# Patient Record
Sex: Male | Born: 1985
Health system: Southern US, Community
[De-identification: ages and names within clinical notes are randomized; demographics above are authoritative.]

## PROBLEM LIST (undated history)

## (undated) HISTORY — PX: COLON SURGERY: SHX602

---

## 2012-09-11 ENCOUNTER — Ambulatory Visit
Admission: RE | Admit: 2012-09-11 | Discharge: 2012-09-11 | Disposition: A | Discharge status: Home / Self Care | Payer: 59 | Source: Ambulatory Visit | Attending: Physician Assistant | Admitting: Physician Assistant

## 2012-09-11 ENCOUNTER — Other Ambulatory Visit: Payer: Self-pay | Admitting: Physician Assistant

## 2012-09-11 DIAGNOSIS — M79609 Pain in unspecified limb: Secondary | ICD-10-CM

## 2016-01-04 ENCOUNTER — Emergency Department (HOSPITAL_COMMUNITY)
Admission: EM | Admit: 2016-01-04 | Discharge: 2016-01-05 | Disposition: A | Discharge status: Home / Self Care | Payer: 59 | Attending: Emergency Medicine | Admitting: Emergency Medicine

## 2016-01-04 ENCOUNTER — Encounter (HOSPITAL_COMMUNITY): Payer: Self-pay

## 2016-01-04 DIAGNOSIS — R1013 Epigastric pain: Secondary | ICD-10-CM | POA: Insufficient documentation

## 2016-01-04 LAB — CBC
HEMATOCRIT: 45.1 % (ref 39.0–52.0)
HEMOGLOBIN: 15.5 g/dL (ref 13.0–17.0)
MCH: 31.4 pg (ref 26.0–34.0)
MCHC: 34.4 g/dL (ref 30.0–36.0)
MCV: 91.5 fL (ref 78.0–100.0)
Platelets: 158 10*3/uL (ref 150–400)
RBC: 4.93 MIL/uL (ref 4.22–5.81)
RDW: 13.4 % (ref 11.5–15.5)
WBC: 7.6 10*3/uL (ref 4.0–10.5)

## 2016-01-04 LAB — URINALYSIS, ROUTINE W REFLEX MICROSCOPIC
Bilirubin Urine: NEGATIVE
Glucose, UA: NEGATIVE mg/dL
HGB URINE DIPSTICK: NEGATIVE
Ketones, ur: NEGATIVE mg/dL
Leukocytes, UA: NEGATIVE
NITRITE: NEGATIVE
PH: 8.5 — AB (ref 5.0–8.0)
Protein, ur: NEGATIVE mg/dL
SPECIFIC GRAVITY, URINE: 1.023 (ref 1.005–1.030)

## 2016-01-04 LAB — COMPREHENSIVE METABOLIC PANEL
ALBUMIN: 3.5 g/dL (ref 3.5–5.0)
ALK PHOS: 55 U/L (ref 38–126)
ALT: 46 U/L (ref 17–63)
ANION GAP: 6 (ref 5–15)
AST: 37 U/L (ref 15–41)
BUN: 14 mg/dL (ref 6–20)
CALCIUM: 9.2 mg/dL (ref 8.9–10.3)
CO2: 31 mmol/L (ref 22–32)
Chloride: 103 mmol/L (ref 101–111)
Creatinine, Ser: 1.11 mg/dL (ref 0.61–1.24)
GFR calc non Af Amer: >60 mL/min (ref >60)
GLUCOSE: 94 mg/dL (ref 65–99)
POTASSIUM: 4.3 mmol/L (ref 3.5–5.1)
SODIUM: 140 mmol/L (ref 135–145)
Total Bilirubin: 0.7 mg/dL (ref 0.3–1.2)
Total Protein: 6.8 g/dL (ref 6.5–8.1)

## 2016-01-04 LAB — LIPASE, BLOOD: LIPASE: 21 U/L (ref 11–51)

## 2016-01-04 MED ORDER — SODIUM CHLORIDE 0.9 % IV BOLUS (SEPSIS)
1000.0000 mL | Freq: Once | INTRAVENOUS | Status: AC
  Administered 2016-01-04: 1000 mL via INTRAVENOUS

## 2016-01-04 MED ORDER — FAMOTIDINE IN NACL 20-0.9 MG/50ML-% IV SOLN
20.0000 mg | Freq: Once | INTRAVENOUS | Status: AC
  Administered 2016-01-04: 20 mg via INTRAVENOUS
  Filled 2016-01-04: qty 50

## 2016-01-04 MED ORDER — DICYCLOMINE HCL 20 MG PO TABS: 20.0000 mg | ORAL_TABLET | Freq: Two times a day (BID) | ORAL | 0 refills | Status: DC

## 2016-01-04 MED ORDER — FAMOTIDINE 20 MG PO TABS: 20.0000 mg | ORAL_TABLET | Freq: Two times a day (BID) | ORAL | 0 refills | Status: DC

## 2016-01-04 NOTE — ED Provider Notes (Signed)
[facility]-EMERGENCY DEPT Provider Note   CSN: [id number] Arrival date & time: 01/04/16  1843     History   Chief Complaint Chief Complaint  Patient presents with  . Abdominal Pain    HPI Michael Rhodes is a 30 y.o. male.  The history is provided by the patient.  Abdominal Pain   This is a new problem. The current episode started 6 to 12 hours ago (started about 4pm). The problem occurs constantly. The problem has been gradually improving (inensified initially and then got better). The pain is located in the epigastric region. The pain is severe. Pertinent negatives include fever, diarrhea, vomiting and dysuria. Associated symptoms comments: Radiated to the back  . Nothing aggravates the symptoms. Nothing relieves the symptoms. His past medical history does not include gallstones.    History reviewed. No pertinent past medical history.  There are no active problems to display for this patient.   Past Surgical History:  Procedure Laterality Date  . COLON SURGERY     polyp removal       Home Medications    Prior to Admission medications   Medication Sig Start Date End Date Taking? Authorizing Provider  dicyclomine (BENTYL) 20 MG tablet Take 1 tablet (20 mg total) by mouth 2 (two) times daily. 01/04/16   Linwood DibblesJon Norman Piacentini, MD  famotidine (PEPCID) 20 MG tablet Take 1 tablet (20 mg total) by mouth 2 (two) times daily. 01/04/16   Linwood DibblesJon Allyssa Abruzzese, MD    Family History History reviewed. No pertinent family history.  Social History Social History  Substance Use Topics  . Smoking status: Never Smoker  . Smokeless tobacco: Never Used  . Alcohol use Not on file     Allergies   Review of patient's allergies indicates no known allergies.   Review of Systems Review of Systems  Constitutional: Negative for fever.  Gastrointestinal: Positive for abdominal pain. Negative for diarrhea and vomiting.  Genitourinary: Negative for dysuria.  All other systems reviewed and are  negative.    Physical Exam Updated Vital Signs BP 133/91 (BP Location: Left Arm)   Pulse (!) 50   Temp 98.4 F (36.9 C) (Oral)   Resp 18   Ht 6\' 2"  (1.88 m)   Wt 76.7 kg   SpO2 100%   BMI 21.70 kg/m   Physical Exam  Constitutional: He appears well-developed and well-nourished. No distress.  HENT:  Head: Normocephalic and atraumatic.  Right Ear: External ear normal.  Left Ear: External ear normal.  Eyes: Conjunctivae are normal. Right eye exhibits no discharge. Left eye exhibits no discharge. No scleral icterus.  Neck: Neck supple. No tracheal deviation present.  Cardiovascular: Normal rate, regular rhythm and intact distal pulses.   Pulmonary/Chest: Effort normal and breath sounds normal. No stridor. No respiratory distress. He has no wheezes. He has no rales.  Abdominal: Soft. Bowel sounds are normal. He exhibits no distension. There is tenderness in the epigastric area. There is no rebound, no guarding, no tenderness at McBurney's point and negative Murphy's sign.  Musculoskeletal: He exhibits no edema or tenderness.  Neurological: He is alert. He has normal strength. No cranial nerve deficit (no facial droop, extraocular movements intact, no slurred speech) or sensory deficit. He exhibits normal muscle tone. He displays no seizure activity. Coordination normal.  Skin: Skin is warm and dry. No rash noted.  Psychiatric: He has a normal mood and affect.  Nursing note and vitals reviewed.    ED Treatments / Results  Labs (all labs ordered are  listed, but only abnormal results are displayed) Labs Reviewed  URINALYSIS, ROUTINE W REFLEX MICROSCOPIC (NOT AT [facility]) - Abnormal; Notable for the following:       Result Value   pH 8.5 (*)    All other components within normal limits  LIPASE, BLOOD  COMPREHENSIVE METABOLIC PANEL  CBC    EKGProcedures Procedures (including critical care time)  Medications Ordered in ED Medications  sodium chloride 0.9 % bolus 1,000 mL (1,000  mLs Intravenous New Bag/Given 01/04/16 2234)  famotidine (PEPCID) IVPB 20 mg premix (0 mg Intravenous Stopped 01/04/16 2301)     Initial Impression / Assessment and Plan / ED Course  I have reviewed the triage vital signs and the nursing notes.  Pertinent labs & imaging results that were available during my care of the patient were reviewed by me and considered in my medical decision making (see chart for details).  Clinical Course  Comment By Time  Discussed initial test results with patient.  Pain is improving but still has some ttp in the epigastric region.  Discussed [place].  Pt is concerned about cost.  No focal ttp in the epigastric region.   Overall, doubt acute cholecystitis.  Will give dose of antacids, check urine and reassess Linwood Dibbles, MD 08/31 2210    Pt is feeling better.  He would prefer to avoid any imaging unless necessary.  Overall I have a low suspicion for cholecystitis or other emergent surgical issue.  Warning signs and precautions discussed.  Final Clinical Impressions(s) / ED Diagnoses   Final diagnoses:  Epigastric pain    New Prescriptions New Prescriptions   DICYCLOMINE (BENTYL) 20 MG TABLET    Take 1 tablet (20 mg total) by mouth 2 (two) times daily.   FAMOTIDINE (PEPCID) 20 MG TABLET    Take 1 tablet (20 mg total) by mouth 2 (two) times daily.     Linwood Dibbles, MD 01/04/16 [phone]

## 2016-01-04 NOTE — ED Triage Notes (Signed)
Pt presents from home via EMS with c/o abdominal pain. [facility] and after a nap this afternoon, is having sudden onset mid, sharp abdominal pain. Pt has no obvious mass or distention noted, just tenderness to palpation. No nausea or vomiting, denies diarrhea.

## 2016-01-04 NOTE — ED Notes (Signed)
Pt stated "[facility] and shortly after my stomach starting hurting really bad."  Pt denies n/v/d.

## 2016-01-04 NOTE — ED Notes (Signed)
Bed: WTR9 Expected date:  Expected time:  Means of arrival:  Comments: 

## 2016-01-04 NOTE — Progress Notes (Signed)
Patient reports his pcp is located at [facility] on [address]  System updated.

## 2016-01-04 NOTE — Discharge Instructions (Signed)
Return to the emergency room for fever vomiting or recurrent symptoms. Follow up with primary doctor next week to be rechecked

## 2016-01-05 MED ORDER — FAMOTIDINE 20 MG PO TABS: 20.0000 mg | ORAL_TABLET | Freq: Two times a day (BID) | ORAL | 0 refills | Status: DC

## 2016-01-05 MED ORDER — DICYCLOMINE HCL 20 MG PO TABS: 20.0000 mg | ORAL_TABLET | Freq: Two times a day (BID) | ORAL | 0 refills | Status: DC

## 2017-04-01 ENCOUNTER — Ambulatory Visit
Admission: RE | Admit: 2017-04-01 | Discharge: 2017-04-01 | Disposition: A | Discharge status: Home / Self Care | Payer: 59 | Source: Ambulatory Visit | Attending: Family Medicine | Admitting: Family Medicine

## 2017-04-01 ENCOUNTER — Other Ambulatory Visit: Payer: Self-pay | Admitting: Family Medicine

## 2017-04-01 DIAGNOSIS — M25561 Pain in right knee: Secondary | ICD-10-CM

## 2017-04-02 ENCOUNTER — Other Ambulatory Visit: Payer: Self-pay | Admitting: Family Medicine

## 2017-04-02 DIAGNOSIS — M25561 Pain in right knee: Secondary | ICD-10-CM

## 2017-04-08 ENCOUNTER — Other Ambulatory Visit: Payer: 59

## 2017-04-20 ENCOUNTER — Other Ambulatory Visit: Payer: 59

## 2017-04-25 ENCOUNTER — Ambulatory Visit: Payer: 59

## 2017-05-10 ENCOUNTER — Inpatient Hospital Stay: Admission: RE | Admit: 2017-05-10 | Payer: 59 | Source: Ambulatory Visit

## 2017-06-02 ENCOUNTER — Inpatient Hospital Stay: Admission: RE | Admit: 2017-06-02 | Payer: 59 | Source: Ambulatory Visit

## 2017-06-11 ENCOUNTER — Other Ambulatory Visit: Payer: Self-pay | Admitting: Family Medicine

## 2017-06-11 DIAGNOSIS — R7401 Elevation of levels of liver transaminase levels: Secondary | ICD-10-CM

## 2017-06-11 DIAGNOSIS — R740 Nonspecific elevation of levels of transaminase and lactic acid dehydrogenase [LDH]: Principal | ICD-10-CM

## 2017-06-23 ENCOUNTER — Ambulatory Visit
Admission: RE | Admit: 2017-06-23 | Discharge: 2017-06-23 | Disposition: A | Discharge status: Home / Self Care | Payer: 59 | Source: Ambulatory Visit | Attending: Family Medicine | Admitting: Family Medicine

## 2017-06-23 DIAGNOSIS — R7401 Elevation of levels of liver transaminase levels: Secondary | ICD-10-CM

## 2017-06-23 DIAGNOSIS — R740 Nonspecific elevation of levels of transaminase and lactic acid dehydrogenase [LDH]: Principal | ICD-10-CM

## 2018-01-09 DIAGNOSIS — R945 Abnormal results of liver function studies: Secondary | ICD-10-CM | POA: Diagnosis not present

## 2018-04-01 DIAGNOSIS — Z202 Contact with and (suspected) exposure to infections with a predominantly sexual mode of transmission: Secondary | ICD-10-CM | POA: Diagnosis not present

## 2018-04-07 DIAGNOSIS — Z113 Encounter for screening for infections with a predominantly sexual mode of transmission: Secondary | ICD-10-CM | POA: Diagnosis not present

## 2018-04-07 DIAGNOSIS — R109 Unspecified abdominal pain: Secondary | ICD-10-CM | POA: Diagnosis not present

## 2018-04-07 DIAGNOSIS — R195 Other fecal abnormalities: Secondary | ICD-10-CM | POA: Diagnosis not present

## 2018-04-30 ENCOUNTER — Telehealth (INDEPENDENT_AMBULATORY_CARE_PROVIDER_SITE_OTHER): Payer: Self-pay | Admitting: Orthopaedic Surgery

## 2018-04-30 NOTE — Telephone Encounter (Signed)
Patient called stating that he is getting billed for something that he has no idea what it is.  He wants someone to give him a call today.  CB#[phone] Thank you.

## 2018-05-19 DIAGNOSIS — Z13220 Encounter for screening for lipoid disorders: Secondary | ICD-10-CM | POA: Diagnosis not present

## 2018-05-19 DIAGNOSIS — Z0000 Encounter for general adult medical examination without abnormal findings: Secondary | ICD-10-CM | POA: Diagnosis not present

## 2018-10-22 ENCOUNTER — Encounter (HOSPITAL_COMMUNITY): Payer: Self-pay

## 2018-10-22 ENCOUNTER — Emergency Department (HOSPITAL_COMMUNITY)
Admission: EM | Admit: 2018-10-22 | Discharge: 2018-10-22 | Disposition: A | Discharge status: Home / Self Care | Payer: BC Managed Care – PPO | Attending: Emergency Medicine | Admitting: Emergency Medicine

## 2018-10-22 ENCOUNTER — Emergency Department (HOSPITAL_COMMUNITY): Payer: BC Managed Care – PPO

## 2018-10-22 ENCOUNTER — Other Ambulatory Visit: Payer: Self-pay

## 2018-10-22 DIAGNOSIS — R112 Nausea with vomiting, unspecified: Secondary | ICD-10-CM

## 2018-10-22 DIAGNOSIS — R1084 Generalized abdominal pain: Secondary | ICD-10-CM | POA: Diagnosis not present

## 2018-10-22 DIAGNOSIS — R109 Unspecified abdominal pain: Secondary | ICD-10-CM | POA: Diagnosis not present

## 2018-10-22 LAB — COMPREHENSIVE METABOLIC PANEL
ALT: 41 U/L (ref 0–44)
AST: 37 U/L (ref 15–41)
Albumin: 3.6 g/dL (ref 3.5–5.0)
Alkaline Phosphatase: 49 U/L (ref 38–126)
Anion gap: 12 (ref 5–15)
BUN: 14 mg/dL (ref 6–20)
CO2: 28 mmol/L (ref 22–32)
Calcium: 9.1 mg/dL (ref 8.9–10.3)
Chloride: 102 mmol/L (ref 98–111)
Creatinine, Ser: 0.98 mg/dL (ref 0.61–1.24)
GFR calc Af Amer: >60 mL/min (ref >60)
GFR calc non Af Amer: >60 mL/min (ref >60)
Glucose, Bld: 127 mg/dL — ABNORMAL HIGH (ref 70–99)
Potassium: 3.4 mmol/L — ABNORMAL LOW (ref 3.5–5.1)
Sodium: 142 mmol/L (ref 135–145)
Total Bilirubin: 0.4 mg/dL (ref 0.3–1.2)
Total Protein: 7.4 g/dL (ref 6.5–8.1)

## 2018-10-22 LAB — CBC
HCT: 47.3 % (ref 39.0–52.0)
Hemoglobin: 15.4 g/dL (ref 13.0–17.0)
MCH: 30.4 pg (ref 26.0–34.0)
MCHC: 32.6 g/dL (ref 30.0–36.0)
MCV: 93.5 fL (ref 80.0–100.0)
Platelets: 182 10*3/uL (ref 150–400)
RBC: 5.06 MIL/uL (ref 4.22–5.81)
RDW: 12.5 % (ref 11.5–15.5)
WBC: 9.7 10*3/uL (ref 4.0–10.5)
nRBC: 0 % (ref 0.0–0.2)

## 2018-10-22 LAB — LIPASE, BLOOD: Lipase: 28 U/L (ref 11–51)

## 2018-10-22 NOTE — ED Provider Notes (Signed)
[facility] DEPT Provider Note: Georgena Spurling, MD, FACEP  CSN: [id number] MRN: [id number] ARRIVAL: 10/22/18 at [place]: [place]  Abdominal Pain   HISTORY OF PRESENT ILLNESS  10/22/18 4:09 AM Michael Rhodes is a 33 y.o. male who complains of his fairly sudden onset of generalized abdominal pain about 11 PM yesterday evening.  He states his abdomen was distended and the pain was severe.  Pain was worse with movement or palpation.  There was associated nausea and vomiting.  He did not have a bowel movement while symptomatic.  The symptoms are also associated with excessive belching.  His nurse had to assist him in going to the bathroom he was in so much pain.  After about an hour in the ED he fell asleep and is now asymptomatic.   History reviewed. No pertinent past medical history.  Past Surgical History:  Procedure Laterality Date  . COLON SURGERY     polyp removal    History reviewed. No pertinent family history.  Social History   Tobacco Use  . Smoking status: Never Smoker  . Smokeless tobacco: Never Used  Substance Use Topics  . Alcohol use: Not on file  . Drug use: Yes    Types: Marijuana    Prior to Admission medications   Not on File    Allergies Patient has no known allergies.   REVIEW OF SYSTEMS  Negative except as noted here or in the History of Present Illness.   PHYSICAL EXAMINATION  Initial Vital Signs Blood pressure 115/81, pulse 62, temperature 97.9 F (36.6 C), temperature source Oral, resp. rate 18, SpO2 98 %.  Examination General: Well-developed, well-nourished male in no acute distress; appearance consistent with age of record HENT: normocephalic; atraumatic Eyes: pupils equal, round and reactive to light; extraocular muscles intact Neck: supple Heart: regular rate and rhythm Lungs: clear to auscultation bilaterally Abdomen: soft; nondistended; nontender; no masses or hepatosplenomegaly; bowel sounds present  Extremities: No deformity; full range of motion; pulses normal Neurologic: Awake, alert and oriented; motor function intact in all extremities and symmetric; no facial droop Skin: Warm and dry Psychiatric: Normal mood and affect   RESULTS  Summary of this visit's results, reviewed by myself:   EKG Interpretation  Date/Time:    Ventricular Rate:    PR Interval:    QRS Duration:   QT Interval:    QTC Calculation:   R Axis:     Text Interpretation:        Laboratory Studies: Results for orders placed or performed during the hospital encounter of 10/22/18 (from the past 24 hour(s))  Lipase, blood     Status: None   Collection Time: 10/22/18  2:15 AM  Result Value Ref Range   Lipase 28 11 - 51 U/L  Comprehensive metabolic panel     Status: Abnormal   Collection Time: 10/22/18  2:15 AM  Result Value Ref Range   Sodium 142 135 - 145 mmol/L   Potassium 3.4 (L) 3.5 - 5.1 mmol/L   Chloride 102 98 - 111 mmol/L   CO2 28 22 - 32 mmol/L   Glucose, Bld 127 (H) 70 - 99 mg/dL   BUN 14 6 - 20 mg/dL   Creatinine, Ser 0.98 0.61 - 1.24 mg/dL   Calcium 9.1 8.9 - 10.3 mg/dL   Total Protein 7.4 6.5 - 8.1 g/dL   Albumin 3.6 3.5 - 5.0 g/dL   AST 37 15 - 41 U/L   ALT 41 0 -  44 U/L   Alkaline Phosphatase 49 38 - 126 U/L   Total Bilirubin 0.4 0.3 - 1.2 mg/dL   GFR calc non Af Amer >60 >60 mL/min   GFR calc Af Amer >60 >60 mL/min   Anion gap 12 5 - 15  CBC     Status: None   Collection Time: 10/22/18  2:15 AM  Result Value Ref Range   WBC 9.7 4.0 - 10.5 K/uL   RBC 5.06 4.22 - 5.81 MIL/uL   Hemoglobin 15.4 13.0 - 17.0 g/dL   HCT 40.947.3 81.139.0 - 91.452.0 %   MCV 93.5 80.0 - 100.0 fL   MCH 30.4 26.0 - 34.0 pg   MCHC 32.6 30.0 - 36.0 g/dL   RDW 78.212.5 95.611.5 - 21.315.5 %   Platelets 182 150 - 400 K/uL   nRBC 0.0 0.0 - 0.2 %   Imaging Studies: Dg Abd Acute 2+v W 1v Chest  Result Date: 10/22/2018 CLINICAL DATA:  Sudden onset of mid abdominal pain and bloating. EXAM: DG ABDOMEN ACUTE W/ 1V CHEST  COMPARISON:  None. FINDINGS: The heart size is normal. There is no edema or effusion. No focal airspace disease is present. Bowel gas pattern is normal. Moderate stool is present in the ascending and proximal transverse colon without obstruction. Axial skeleton is within normal limits. IMPRESSION: Negative abdominal radiographs.  No acute cardiopulmonary disease. Electronically Signed   By: Marin Robertshristopher  Mattern M.D.   On: 10/22/2018 05:12    ED COURSE and MDM  Nursing notes and initial vitals signs, including pulse oximetry, reviewed.  Vitals:   10/22/18 0430 10/22/18 0445 10/22/18 0500 10/22/18 0530  BP: 114/74 121/80 116/76 117/79  Pulse: 80 (!) 56 60 (!) 58  Resp: 16 16  18   Temp:      TempSrc:      SpO2: 100% 96% 97% 98%   5:44 AM Cause of the patient's abdominal pain is not clear.  It was not focal to suggest appendicitis or cholecystitis.  The accompanying vomiting and belching suggest a gastric etiology.  He was advised of reassuring diagnostic studies but should return if symptoms worsen.  PROCEDURES    ED DIAGNOSES     ICD-10-CM   1. Generalized abdominal pain  R10.84   2. Nausea and vomiting in adult  R11.2        Melane Windholz, Jonny RuizJohn, MD 10/22/18 [phone]

## 2018-10-22 NOTE — ED Triage Notes (Signed)
Pt reports intense abdominal pain that started about 4 hours ago. He states that the pain is right in his stomach and feels really tight. States that this happened a few years ago as well. Denies vomiting or diarrhea.

## 2018-10-22 NOTE — ED Notes (Signed)
Pt doubled over while ambulating to the restroom, c/o "abdominal pain all over" and belching frequently.

## 2018-12-15 DIAGNOSIS — K219 Gastro-esophageal reflux disease without esophagitis: Secondary | ICD-10-CM | POA: Diagnosis not present

## 2018-12-15 DIAGNOSIS — M545 Low back pain: Secondary | ICD-10-CM | POA: Diagnosis not present

## 2018-12-15 DIAGNOSIS — R079 Chest pain, unspecified: Secondary | ICD-10-CM | POA: Diagnosis not present

## 2019-05-31 DIAGNOSIS — Z113 Encounter for screening for infections with a predominantly sexual mode of transmission: Secondary | ICD-10-CM | POA: Diagnosis not present

## 2019-05-31 DIAGNOSIS — Z8342 Family history of familial hypercholesterolemia: Secondary | ICD-10-CM | POA: Diagnosis not present

## 2019-05-31 DIAGNOSIS — Z13220 Encounter for screening for lipoid disorders: Secondary | ICD-10-CM | POA: Diagnosis not present

## 2019-05-31 DIAGNOSIS — N469 Male infertility, unspecified: Secondary | ICD-10-CM | POA: Diagnosis not present

## 2019-05-31 DIAGNOSIS — K5900 Constipation, unspecified: Secondary | ICD-10-CM | POA: Diagnosis not present

## 2019-05-31 DIAGNOSIS — R140 Abdominal distension (gaseous): Secondary | ICD-10-CM | POA: Diagnosis not present

## 2019-05-31 DIAGNOSIS — Z0000 Encounter for general adult medical examination without abnormal findings: Secondary | ICD-10-CM | POA: Diagnosis not present

## 2019-06-29 DIAGNOSIS — Z3141 Encounter for fertility testing: Secondary | ICD-10-CM | POA: Diagnosis not present

## 2019-08-17 DIAGNOSIS — Z3141 Encounter for fertility testing: Secondary | ICD-10-CM | POA: Diagnosis not present

## 2019-10-01 DIAGNOSIS — K5900 Constipation, unspecified: Secondary | ICD-10-CM | POA: Diagnosis not present

## 2019-10-01 DIAGNOSIS — K219 Gastro-esophageal reflux disease without esophagitis: Secondary | ICD-10-CM | POA: Diagnosis not present

## 2019-10-01 DIAGNOSIS — R079 Chest pain, unspecified: Secondary | ICD-10-CM | POA: Diagnosis not present

## 2019-10-06 DIAGNOSIS — Z3141 Encounter for fertility testing: Secondary | ICD-10-CM | POA: Diagnosis not present

## 2020-03-22 DIAGNOSIS — Z20822 Contact with and (suspected) exposure to covid-19: Secondary | ICD-10-CM | POA: Diagnosis not present

## 2020-03-22 DIAGNOSIS — Z03818 Encounter for observation for suspected exposure to other biological agents ruled out: Secondary | ICD-10-CM | POA: Diagnosis not present

## 2020-04-16 DIAGNOSIS — Z20822 Contact with and (suspected) exposure to covid-19: Secondary | ICD-10-CM | POA: Diagnosis not present

## 2020-06-02 DIAGNOSIS — Z13220 Encounter for screening for lipoid disorders: Secondary | ICD-10-CM | POA: Diagnosis not present

## 2020-06-02 DIAGNOSIS — Z0000 Encounter for general adult medical examination without abnormal findings: Secondary | ICD-10-CM | POA: Diagnosis not present

## 2020-06-02 DIAGNOSIS — M79605 Pain in left leg: Secondary | ICD-10-CM | POA: Diagnosis not present

## 2020-06-02 DIAGNOSIS — Z113 Encounter for screening for infections with a predominantly sexual mode of transmission: Secondary | ICD-10-CM | POA: Diagnosis not present

## 2020-06-20 ENCOUNTER — Ambulatory Visit: Payer: Self-pay

## 2020-06-20 ENCOUNTER — Encounter: Payer: Self-pay | Admitting: Orthopedic Surgery

## 2020-06-20 ENCOUNTER — Ambulatory Visit (INDEPENDENT_AMBULATORY_CARE_PROVIDER_SITE_OTHER): Payer: BC Managed Care – PPO | Admitting: Orthopedic Surgery

## 2020-06-20 DIAGNOSIS — G8929 Other chronic pain: Secondary | ICD-10-CM

## 2020-06-20 DIAGNOSIS — M545 Low back pain, unspecified: Secondary | ICD-10-CM | POA: Diagnosis not present

## 2020-06-20 DIAGNOSIS — G959 Disease of spinal cord, unspecified: Secondary | ICD-10-CM

## 2020-06-20 NOTE — Progress Notes (Addendum)
 Office Visit Note   Patient: Michael Rhodes           Date of Birth: 1986-01-03           MRN: [id number] Visit Date: 06/20/2020              Requested by: Sun, Vyvyan, MD [phone] [name] [place],  [place] [address] PCP: Austin Nutley, MD  Chief Complaint  Patient presents with   Left Leg - Weakness      HPI: Patient is a 35 year old gentleman who is states he is had about a 43-month history of hip flexion weakness on the left.  Patient states that he noticed this when he was skydiving and had to flex his hips and he was unable to do that.  He states his leg feels heavy.  Denies any other symptoms such as back pain or hip pain.  Assessment & Plan: Visit Diagnoses:  1. Chronic low back pain, unspecified back pain laterality, unspecified whether sciatica present   2. Disease of spinal cord (HCC)     Plan: We will obtain an MRI scan of the lumbar spine..  The significant hip flexor weakness there is concern for spinal pathology as the etiology.  Follow-Up Instructions: No follow-ups on file.   Ortho Exam  Patient is alert, oriented, no adenopathy, well-dressed, normal affect, normal respiratory effort. Examination patient has a normal gait no abductor lurch he has no pain with range of motion of the hip knee or ankle has a negative straight leg raise.  He has good motor strength in all motor groups of both lower extremities except that his hip flexor strength on the left is significantly weaker than the hip flexor strength on the right hip flexor strength on the left is 4/5 and I can easily break it.  Imaging: XR Lumbar Spine 2-3 Views  Result Date: 06/20/2020 2 view radiographs of the lumbar spine shows congruent hip joints with no femoral head or acetabular irregularities the joints are congruent lumbar spine shows no pars defect no compression fractures.  No images are attached to the encounter.  Labs: No results found for: HGBA1C, ESRSEDRATE, CRP, LABURIC,  REPTSTATUS, GRAMSTAIN, CULT, LABORGA   Lab Results  Component Value Date   ALBUMIN 3.6 10/22/2018   ALBUMIN 3.5 01/04/2016    No results found for: MG No results found for: VD25OH  No results found for: PREALBUMIN CBC EXTENDED Latest Ref Rng & Units 10/22/2018 01/04/2016  WBC 4.0 - 10.5 K/uL 9.7 7.6  RBC 4.22 - 5.81 MIL/uL 5.06 4.93  HGB 13.0 - 17.0 g/dL 84.5 84.4  HCT 60.9 - 47.9 % 47.3 45.1  PLT 150 - 400 K/uL 182 158     There is no height or weight on file to calculate BMI.  Orders:  Orders Placed This Encounter  Procedures   XR Lumbar Spine 2-3 Views   No orders of the defined types were placed in this encounter.    Procedures: No procedures performed  Clinical Data: No additional findings.  ROS:  All other systems negative, except as noted in the HPI. Review of Systems  Objective: Vital Signs: There were no vitals taken for this visit.  Specialty Comments:  No specialty comments available.  PMFS History: There are no problems to display for this patient.  History reviewed. No pertinent past medical history.  History reviewed. No pertinent family history.  Past Surgical History:  Procedure Laterality Date   COLON SURGERY  polyp removal   Social History   Occupational History   Not on file  Tobacco Use   Smoking status: Never Smoker   Smokeless tobacco: Never Used  Substance and Sexual Activity   Alcohol use: Not on file   Drug use: Yes    Types: Marijuana   Sexual activity: Not on file

## 2020-07-13 ENCOUNTER — Other Ambulatory Visit: Payer: Self-pay

## 2020-07-13 ENCOUNTER — Ambulatory Visit
Admission: RE | Admit: 2020-07-13 | Discharge: 2020-07-13 | Disposition: A | Discharge status: Home / Self Care | Payer: BC Managed Care – PPO | Source: Ambulatory Visit | Attending: Orthopedic Surgery | Admitting: Orthopedic Surgery

## 2020-07-13 DIAGNOSIS — G959 Disease of spinal cord, unspecified: Secondary | ICD-10-CM | POA: Diagnosis not present

## 2020-07-13 DIAGNOSIS — R531 Weakness: Secondary | ICD-10-CM | POA: Diagnosis not present

## 2020-12-26 DIAGNOSIS — Z113 Encounter for screening for infections with a predominantly sexual mode of transmission: Secondary | ICD-10-CM | POA: Diagnosis not present

## 2021-03-23 DIAGNOSIS — Z113 Encounter for screening for infections with a predominantly sexual mode of transmission: Secondary | ICD-10-CM | POA: Diagnosis not present

## 2021-03-23 DIAGNOSIS — Q386 Other congenital malformations of mouth: Secondary | ICD-10-CM | POA: Diagnosis not present

## 2021-05-01 IMAGING — MR MR LUMBAR SPINE W/O CM
4 of 5 series · 27 of 48 positions shown · non-contrast
Comparison: None.

CLINICAL DATA: Myelopathy, acute or progressive left hip flexor
weakness.

EXAM:
MRI LUMBAR SPINE WITHOUT CONTRAST
TECHNIQUE: Multiplanar, multisequence MR imaging of the lumbar spine was
performed. No intravenous contrast was administered.

[Series 3: T2 · sagittal · 4.0mm · 1.09mm/px · 6 of 17 slices shown (1 of 2)]
[im 1/17]
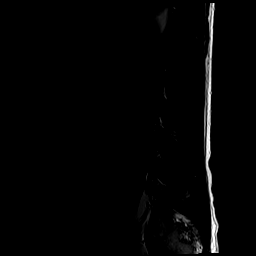
[im 4/17]
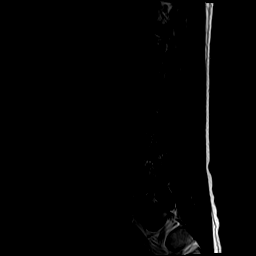
[im 7/17]
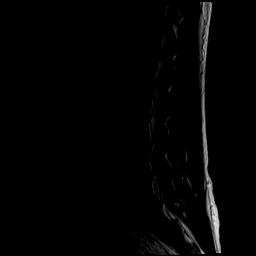
[im 10/17]
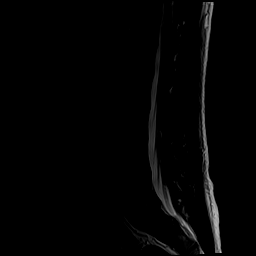
[im 13/17]
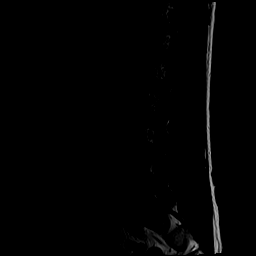
[im 17/17]
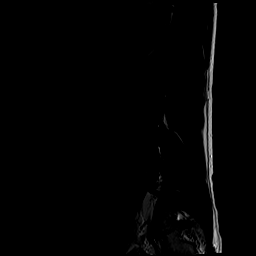

[Series 5: T1 · sagittal · 4.0mm · 1.09mm/px · 6 of 17 slices shown (1 of 2)]
[im 1/17]
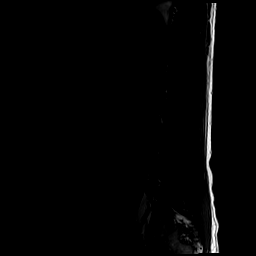
[im 4/17]
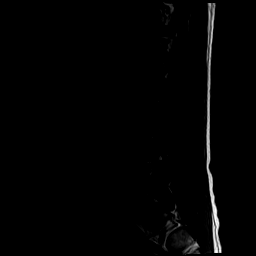
[im 7/17]
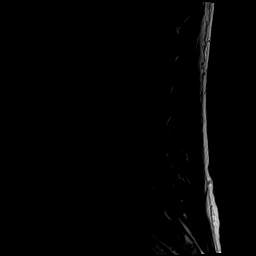
[im 10/17]
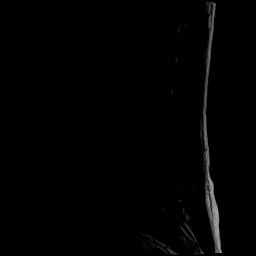
[im 13/17]
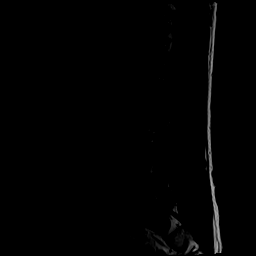
[im 17/17]
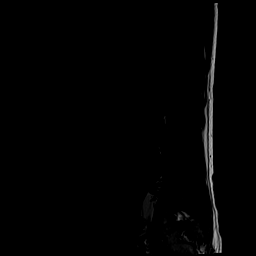

[Series 6: T2 · axial · 4.0mm · 0.39mm/px · z∈[-58,+172]mm · 9 of 44 slices shown (2 of 2)]
[im 1/44]
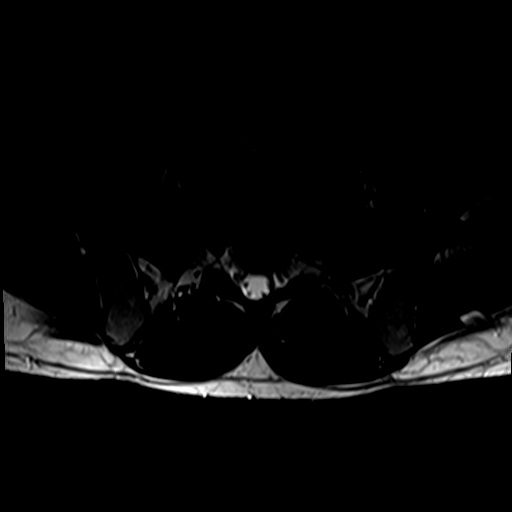
[im 7/44]
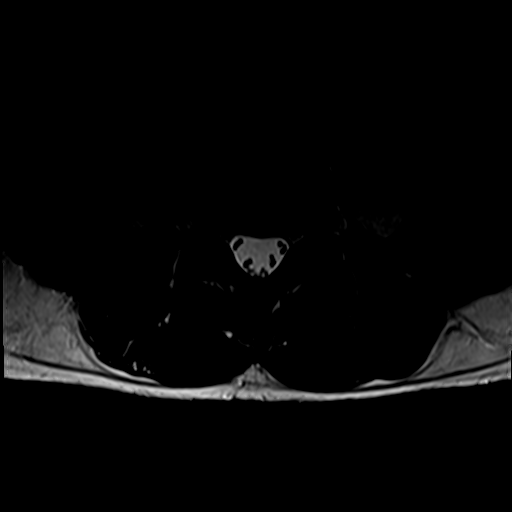
[im 13/44]
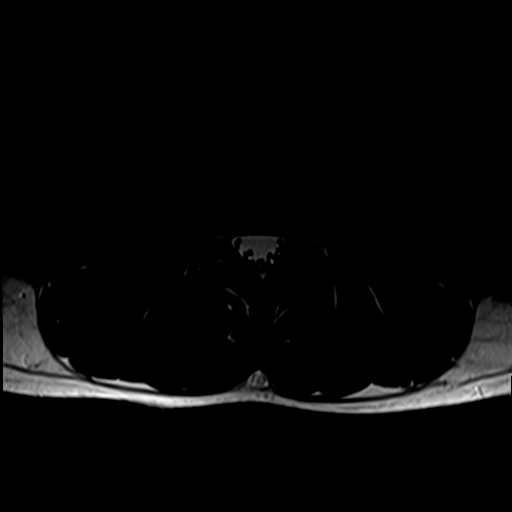
[im 19/44]
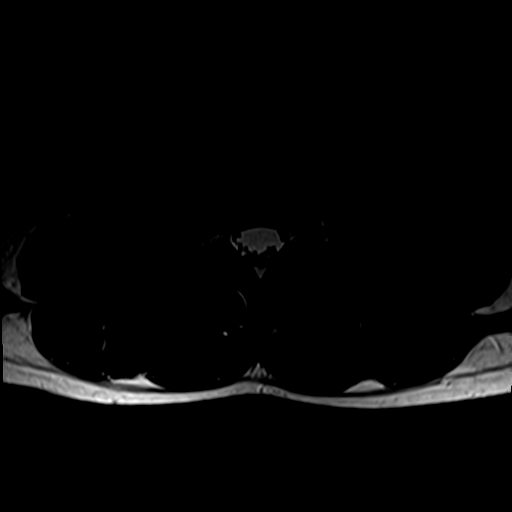
[im 22/44]
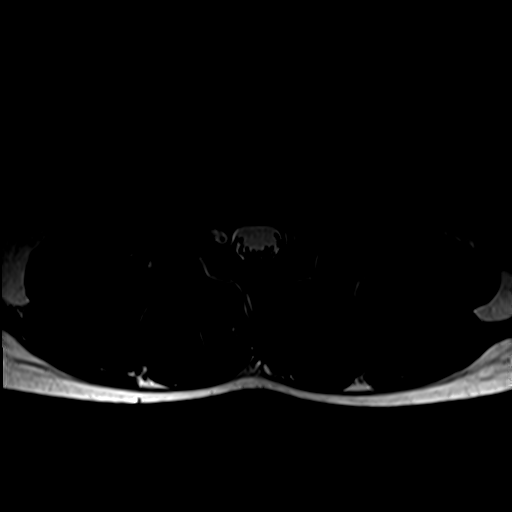
[im 25/44]
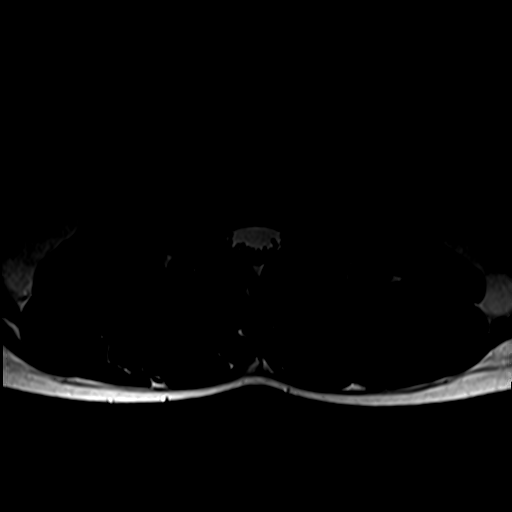
[im 31/44]
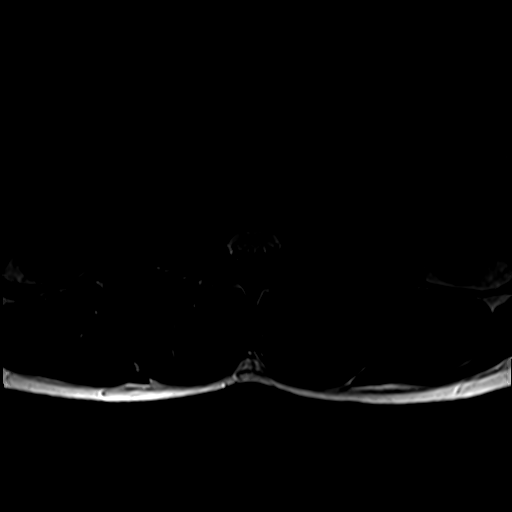
[im 37/44]
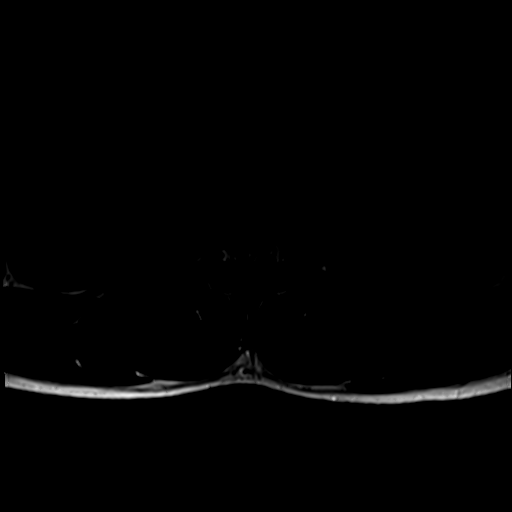
[im 44/44]
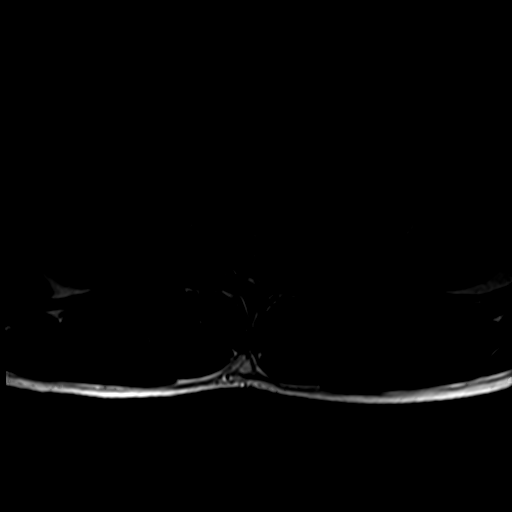

[Series 7: T1 · axial · 4.0mm · 0.39mm/px · z∈[-58,+139]mm · 6 of 44 slices shown (2 of 2)]
[im 1/44]
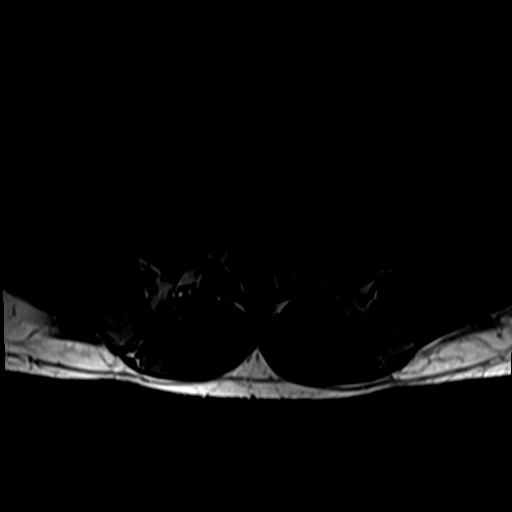
[im 7/44]
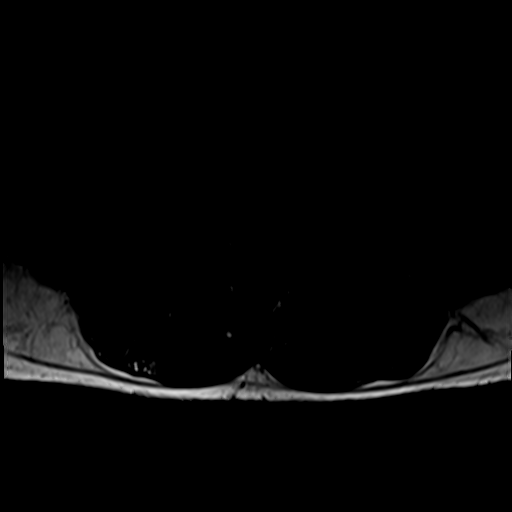
[im 13/44]
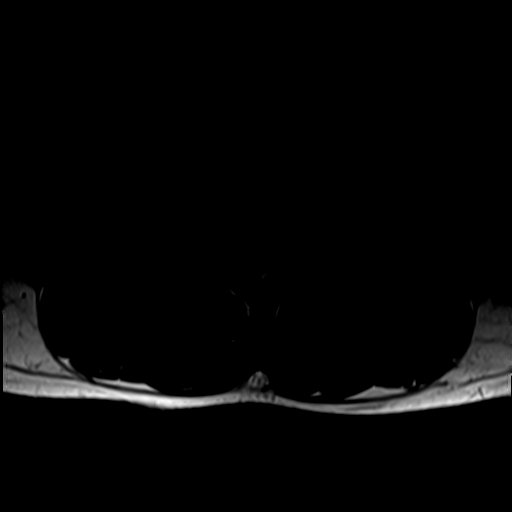
[im 19/44]
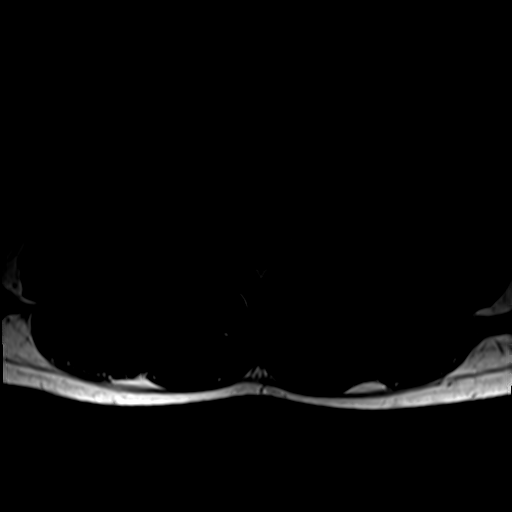
[im 22/44]
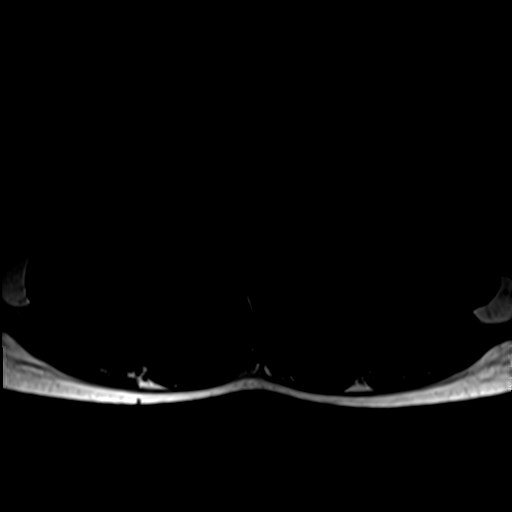
[im 37/44]
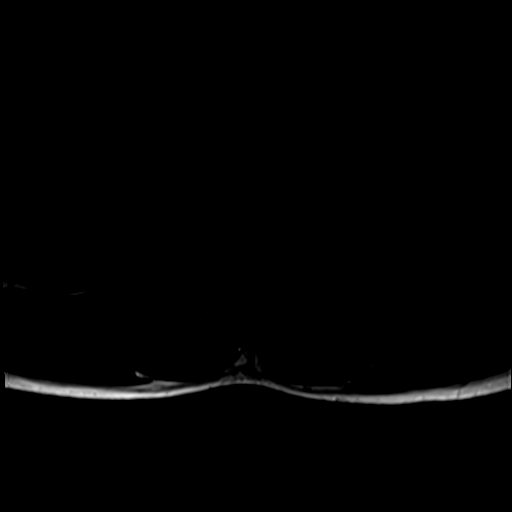

[27 of 48 positions shown; findings below may reference images not displayed]

FINDINGS: Segmentation:  Standard.

Alignment:  Physiologic.

Vertebrae:  No fracture, evidence of discitis, or bone lesion.

Conus medullaris and cauda equina: Conus extends to the T12-L1
level. Small focus of increased signal is seen within the con
medullaris at the level of T12 on the STIR sequence (series 4, image
9), not well seen on the T2 sequence and only imaged on the sagittal
plane.

Paraspinal and other soft tissues: Negative.

Disc levels:

No significant disc herniation, spinal canal or neural foraminal
stenosis at any lumbar level.
IMPRESSION: 1. Small focus of increased signal within the con medullaris at the
level of T12 on the STIR sequence, not well seen on the T2 sequence
and only imaged on the sagittal plane. This may represent a a cord
lesion versus artifact. Recommend MRI of the thoracic spine without
and with contrast.
2. Otherwise unremarkable MRI of the lumbar spine.

## 2021-06-15 DIAGNOSIS — Z13220 Encounter for screening for lipoid disorders: Secondary | ICD-10-CM | POA: Diagnosis not present

## 2021-06-15 DIAGNOSIS — Z0000 Encounter for general adult medical examination without abnormal findings: Secondary | ICD-10-CM | POA: Diagnosis not present

## 2021-07-06 DIAGNOSIS — S39012A Strain of muscle, fascia and tendon of lower back, initial encounter: Secondary | ICD-10-CM | POA: Diagnosis not present

## 2021-08-13 DIAGNOSIS — R221 Localized swelling, mass and lump, neck: Secondary | ICD-10-CM | POA: Diagnosis not present

## 2021-08-13 DIAGNOSIS — Z113 Encounter for screening for infections with a predominantly sexual mode of transmission: Secondary | ICD-10-CM | POA: Diagnosis not present

## 2021-08-14 ENCOUNTER — Other Ambulatory Visit: Payer: Self-pay | Admitting: Physician Assistant

## 2021-08-14 DIAGNOSIS — R221 Localized swelling, mass and lump, neck: Secondary | ICD-10-CM

## 2021-11-21 DIAGNOSIS — Z113 Encounter for screening for infections with a predominantly sexual mode of transmission: Secondary | ICD-10-CM | POA: Diagnosis not present

## 2021-12-26 DIAGNOSIS — N469 Male infertility, unspecified: Secondary | ICD-10-CM | POA: Diagnosis not present

## 2021-12-26 DIAGNOSIS — N4611 Organic oligospermia: Secondary | ICD-10-CM | POA: Diagnosis not present

## 2022-01-22 DIAGNOSIS — Z3141 Encounter for fertility testing: Secondary | ICD-10-CM | POA: Diagnosis not present

## 2022-02-28 DIAGNOSIS — N4601 Organic azoospermia: Secondary | ICD-10-CM | POA: Diagnosis not present

## 2022-02-28 DIAGNOSIS — I861 Scrotal varices: Secondary | ICD-10-CM | POA: Diagnosis not present

## 2022-03-04 DIAGNOSIS — Z202 Contact with and (suspected) exposure to infections with a predominantly sexual mode of transmission: Secondary | ICD-10-CM | POA: Diagnosis not present

## 2022-03-31 DIAGNOSIS — Z113 Encounter for screening for infections with a predominantly sexual mode of transmission: Secondary | ICD-10-CM | POA: Diagnosis not present

## 2022-04-08 DIAGNOSIS — N342 Other urethritis: Secondary | ICD-10-CM | POA: Diagnosis not present

## 2022-04-18 DIAGNOSIS — N342 Other urethritis: Secondary | ICD-10-CM | POA: Diagnosis not present

## 2022-05-16 DIAGNOSIS — I861 Scrotal varices: Secondary | ICD-10-CM | POA: Diagnosis not present

## 2022-05-16 DIAGNOSIS — N46123 Oligospermia due to obstruction of efferent ducts: Secondary | ICD-10-CM | POA: Diagnosis not present

## 2022-05-19 DIAGNOSIS — J101 Influenza due to other identified influenza virus with other respiratory manifestations: Secondary | ICD-10-CM | POA: Diagnosis not present

## 2022-05-21 DIAGNOSIS — R5383 Other fatigue: Secondary | ICD-10-CM | POA: Diagnosis not present

## 2022-06-25 DIAGNOSIS — Z113 Encounter for screening for infections with a predominantly sexual mode of transmission: Secondary | ICD-10-CM | POA: Diagnosis not present

## 2022-07-10 DIAGNOSIS — Z113 Encounter for screening for infections with a predominantly sexual mode of transmission: Secondary | ICD-10-CM | POA: Diagnosis not present

## 2022-07-10 DIAGNOSIS — Z13220 Encounter for screening for lipoid disorders: Secondary | ICD-10-CM | POA: Diagnosis not present

## 2022-07-10 DIAGNOSIS — Z0000 Encounter for general adult medical examination without abnormal findings: Secondary | ICD-10-CM | POA: Diagnosis not present

## 2022-08-14 DIAGNOSIS — Z113 Encounter for screening for infections with a predominantly sexual mode of transmission: Secondary | ICD-10-CM | POA: Diagnosis not present

## 2022-09-24 DIAGNOSIS — I861 Scrotal varices: Secondary | ICD-10-CM | POA: Diagnosis not present

## 2022-11-06 DIAGNOSIS — Z113 Encounter for screening for infections with a predominantly sexual mode of transmission: Secondary | ICD-10-CM | POA: Diagnosis not present

## 2023-01-08 DIAGNOSIS — Z3141 Encounter for fertility testing: Secondary | ICD-10-CM | POA: Diagnosis not present

## 2023-01-09 DIAGNOSIS — I861 Scrotal varices: Secondary | ICD-10-CM | POA: Diagnosis not present

## 2023-02-03 DIAGNOSIS — Z113 Encounter for screening for infections with a predominantly sexual mode of transmission: Secondary | ICD-10-CM | POA: Diagnosis not present

## 2023-04-15 DIAGNOSIS — Z113 Encounter for screening for infections with a predominantly sexual mode of transmission: Secondary | ICD-10-CM | POA: Diagnosis not present

## 2023-04-15 DIAGNOSIS — J029 Acute pharyngitis, unspecified: Secondary | ICD-10-CM | POA: Diagnosis not present

## 2023-05-12 ENCOUNTER — Emergency Department (HOSPITAL_COMMUNITY)
Admission: EM | Admit: 2023-05-12 | Discharge: 2023-05-12 | Discharge status: Against Medical Advice | Payer: BC Managed Care – PPO | Source: Home / Self Care

## 2023-05-14 ENCOUNTER — Other Ambulatory Visit: Payer: Self-pay

## 2023-05-14 ENCOUNTER — Emergency Department (HOSPITAL_BASED_OUTPATIENT_CLINIC_OR_DEPARTMENT_OTHER)
Admission: EM | Admit: 2023-05-14 | Discharge: 2023-05-14 | Disposition: A | Discharge status: Home / Self Care | Payer: BC Managed Care – PPO | Attending: Emergency Medicine | Admitting: Emergency Medicine

## 2023-05-14 ENCOUNTER — Emergency Department (HOSPITAL_BASED_OUTPATIENT_CLINIC_OR_DEPARTMENT_OTHER): Payer: BC Managed Care – PPO | Admitting: Radiology

## 2023-05-14 ENCOUNTER — Other Ambulatory Visit (HOSPITAL_BASED_OUTPATIENT_CLINIC_OR_DEPARTMENT_OTHER): Payer: Self-pay

## 2023-05-14 ENCOUNTER — Encounter (HOSPITAL_BASED_OUTPATIENT_CLINIC_OR_DEPARTMENT_OTHER): Payer: Self-pay | Admitting: Emergency Medicine

## 2023-05-14 DIAGNOSIS — M25521 Pain in right elbow: Secondary | ICD-10-CM | POA: Insufficient documentation

## 2023-05-14 DIAGNOSIS — I878 Other specified disorders of veins: Secondary | ICD-10-CM | POA: Diagnosis not present

## 2023-05-14 DIAGNOSIS — M4184 Other forms of scoliosis, thoracic region: Secondary | ICD-10-CM | POA: Diagnosis not present

## 2023-05-14 DIAGNOSIS — Z041 Encounter for examination and observation following transport accident: Secondary | ICD-10-CM | POA: Diagnosis not present

## 2023-05-14 DIAGNOSIS — Y92410 Unspecified street and highway as the place of occurrence of the external cause: Secondary | ICD-10-CM | POA: Diagnosis not present

## 2023-05-14 DIAGNOSIS — M545 Low back pain, unspecified: Secondary | ICD-10-CM | POA: Diagnosis not present

## 2023-05-14 DIAGNOSIS — M546 Pain in thoracic spine: Secondary | ICD-10-CM | POA: Insufficient documentation

## 2023-05-14 MED ORDER — IBUPROFEN 600 MG PO TABS
600.0000 mg | ORAL_TABLET | Freq: Four times a day (QID) | ORAL | 0 refills | Status: AC | PRN
  Filled 2023-05-14: qty 30, 8d supply, fill #0

## 2023-05-14 NOTE — ED Notes (Signed)
 Patient transported to X-ray

## 2023-05-14 NOTE — Discharge Instructions (Signed)
 As discussed, visit to the emergency department today overall reassuring.  X-rays without obvious fracture or dislocation.  Suspect muscular injury as cause of your symptoms.  Continue take anti-inflammatories or treatment of any pain.  Recommend follow-up with your primary care for reassessment.  Please do not hesitate to return if the worrisome signs and symptoms discussed to become apparent.
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
## 2023-05-14 NOTE — ED Notes (Signed)
 Pt given discharge instructions and reviewed prescriptions. Opportunities given for questions. Pt verbalizes understanding. Jillyn Hidden, RN
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
## 2023-05-26 ENCOUNTER — Other Ambulatory Visit (HOSPITAL_BASED_OUTPATIENT_CLINIC_OR_DEPARTMENT_OTHER): Payer: Self-pay

## 2023-08-06 DIAGNOSIS — Z0000 Encounter for general adult medical examination without abnormal findings: Secondary | ICD-10-CM | POA: Diagnosis not present

## 2023-08-06 DIAGNOSIS — Z113 Encounter for screening for infections with a predominantly sexual mode of transmission: Secondary | ICD-10-CM | POA: Diagnosis not present

## 2023-08-06 DIAGNOSIS — Z13220 Encounter for screening for lipoid disorders: Secondary | ICD-10-CM | POA: Diagnosis not present

## 2023-08-13 DIAGNOSIS — R7401 Elevation of levels of liver transaminase levels: Secondary | ICD-10-CM | POA: Diagnosis not present

## 2023-08-13 DIAGNOSIS — Z0000 Encounter for general adult medical examination without abnormal findings: Secondary | ICD-10-CM | POA: Diagnosis not present

## 2023-08-13 DIAGNOSIS — Z23 Encounter for immunization: Secondary | ICD-10-CM | POA: Diagnosis not present

## 2023-08-13 DIAGNOSIS — R29898 Other symptoms and signs involving the musculoskeletal system: Secondary | ICD-10-CM | POA: Diagnosis not present

## 2023-11-06 ENCOUNTER — Encounter: Payer: Self-pay | Admitting: Orthopedic Surgery

## 2023-11-06 ENCOUNTER — Ambulatory Visit: Admitting: Orthopedic Surgery

## 2023-11-06 DIAGNOSIS — S73192A Other sprain of left hip, initial encounter: Secondary | ICD-10-CM
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
## 2023-11-18 ENCOUNTER — Ambulatory Visit
Admission: RE | Admit: 2023-11-18 | Discharge: 2023-11-18 | Disposition: A | Discharge status: Home / Self Care | Source: Ambulatory Visit | Attending: Orthopedic Surgery | Admitting: Orthopedic Surgery

## 2023-11-18 DIAGNOSIS — S73192A Other sprain of left hip, initial encounter: Secondary | ICD-10-CM

## 2023-11-21 ENCOUNTER — Ambulatory Visit: Payer: Self-pay | Admitting: Orthopedic Surgery

## 2023-11-21 ENCOUNTER — Telehealth: Payer: Self-pay | Admitting: Orthopedic Surgery

## 2023-11-21 NOTE — Telephone Encounter (Signed)
-----   Message from Jerona LULLA Sage sent at 11/20/2023  9:30 AM EDT ----- Call patient.  MRI scan of the hip is normal no evidence of labral tear. ----- Message ----- From: Interface, Rad Results In Sent: 11/18/2023   3:03 PM EDT To: Jerona Sage LULLA, MD

## 2023-11-21 NOTE — Telephone Encounter (Signed)
 Opened in error

## 2023-11-21 NOTE — Telephone Encounter (Signed)
 Pt states he's returning Brittany's call regarding test results

## 2023-11-21 NOTE — Telephone Encounter (Signed)
 Called pt, lmtcb   Also sent pt a mychart message of information of MRI.

## 2023-11-24 NOTE — Telephone Encounter (Signed)
 See other note open on this. Getting more info from Dr. Harden about the results.

## 2023-11-25 NOTE — Telephone Encounter (Signed)
Patient scheduled 7/31

## 2023-12-04 ENCOUNTER — Ambulatory Visit (HOSPITAL_BASED_OUTPATIENT_CLINIC_OR_DEPARTMENT_OTHER): Admitting: Orthopaedic Surgery

## 2023-12-04 ENCOUNTER — Encounter (HOSPITAL_BASED_OUTPATIENT_CLINIC_OR_DEPARTMENT_OTHER): Payer: Self-pay

## 2023-12-04 DIAGNOSIS — S73192A Other sprain of left hip, initial encounter: Secondary | ICD-10-CM | POA: Diagnosis not present

## 2023-12-04 NOTE — Progress Notes (Signed)
 Chief Complaint: Left hip locking and giving way     History of Present Illness:    Michael Rhodes is a 38 y.o. male presents today with multiple years of left hip getting more pain after he started having injury where he felt a pop in the hip and subsequently had quite difficult time bending at the hip which made his landing difficult.  Since this time he has tried to stay active although he notes that in the more intense activities like [place] races is difficult and continuing to give way or give out at which time he is unable to move it for multiple minutes at a time and has to pop his back into place.  He is otherwise quite healthy.  He has trialed a good grip strengthening program and a good gluteal strengthening program without any relief or changes in his mechanical symptoms.  He is here today as a referral from Dr. Harden    PMH/PSH/Family History/Social History/Meds/Allergies:   No past medical history on file. Past Surgical History:  Procedure Laterality Date   COLON SURGERY     polyp removal   Social History   Socioeconomic History   Marital status: Single    Spouse name: Not on file   Number of children: Not on file   Years of education: Not on file   Highest education level: Not on file  Occupational History   Not on file  Tobacco Use   Smoking status: Never   Smokeless tobacco: Never  Substance and Sexual Activity   Alcohol use: Not on file   Drug use: Yes    Types: Marijuana   Sexual activity: Not on file  Other Topics Concern   Not on file  Social History Narrative   Not on file   Social Drivers of Health   Financial Resource Strain: Not on file  Food Insecurity: Not on file  Transportation Needs: Not on file  Physical Activity: Not on file  Stress: Not on file  Social Connections: Not on file   No family history on file. No Known Allergies Current Outpatient Medications  Medication Sig Dispense Refill   ibuprofen  (ADVIL ) 600 MG tablet Take 1  tablet (600 mg total) by mouth every 6 (six) hours as needed. 30 tablet 0   No current facility-administered medications for this visit.   No results found.  Review of Systems:   A ROS was performed including pertinent positives and negatives as documented in the HPI.  Physical Exam :   Constitutional: NAD and appears stated age Neurological: Alert and oriented Psych: Appropriate affect and cooperative There were no vitals taken for this visit.   Comprehensive Musculoskeletal Exam:    Left hip with positive FADIR.  There is 40 degrees internal rotation of the hip 45 external rotation of the hip predominantly painful in the internal rotation position.  There is a reproducible click and pop.  He has good abduction strength gait.  He is very tense exam is intact   Imaging:     MRI (left hip): Anterior superior labral tear   I personally reviewed and interpreted the radiographs.   Assessment and Plan:   38 y.o. male with evidence of left hip instability in the setting of anterior superior labral tear.  He does have a positive FADIR maneuver on today's exam.  Given this we did discuss treatment options.  Given the fact that he has trialed strengthening and gluteal strengthening program I did ultimately discuss that he  may be a candidate for hip arthroscopy with labral repair.  I did discuss the risks and limitations.  He would like to consider his options more.     I personally saw and evaluated the patient, and participated in the management and treatment plan.  Elspeth Parker, MD Attending Physician, Orthopedic Surgery  This document was dictated using Dragon voice recognition software. A reasonable attempt at proof reading has been made to minimize errors.

## 2024-01-27 DIAGNOSIS — Z113 Encounter for screening for infections with a predominantly sexual mode of transmission: Secondary | ICD-10-CM | POA: Diagnosis not present

## 2024-02-26 ENCOUNTER — Other Ambulatory Visit: Payer: Self-pay

## 2024-03-08 ENCOUNTER — Encounter: Payer: Self-pay | Admitting: Radiology
